# Patient Record
Sex: Male | Born: 1960 | Race: White | Hispanic: No | Marital: Single | Smoking: Former smoker
Health system: Southern US, Community
[De-identification: ages and names within clinical notes are randomized; demographics above are authoritative.]

## PROBLEM LIST (undated history)

## (undated) DIAGNOSIS — I10 Essential (primary) hypertension: Secondary | ICD-10-CM

## (undated) DIAGNOSIS — I639 Cerebral infarction, unspecified: Secondary | ICD-10-CM

## (undated) HISTORY — PX: HERNIA REPAIR: SHX51

## (undated) HISTORY — PX: KNEE ARTHROSCOPY: SUR90

---

## 2010-05-21 ENCOUNTER — Emergency Department: Payer: Self-pay | Admitting: Emergency Medicine

## 2010-06-30 ENCOUNTER — Emergency Department: Payer: Self-pay | Admitting: Emergency Medicine

## 2010-12-20 ENCOUNTER — Emergency Department: Payer: Self-pay | Admitting: Internal Medicine

## 2011-08-06 ENCOUNTER — Emergency Department: Payer: Self-pay | Admitting: Emergency Medicine

## 2011-08-07 ENCOUNTER — Inpatient Hospital Stay: Payer: Self-pay | Admitting: Surgery

## 2011-08-07 LAB — COMPREHENSIVE METABOLIC PANEL
Albumin: 3.9 g/dL (ref 3.4–5.0)
Alkaline Phosphatase: 44 U/L — ABNORMAL LOW (ref 50–136)
Calcium, Total: 9 mg/dL (ref 8.5–10.1)
Chloride: 104 mmol/L (ref 98–107)
Co2: 25 mmol/L (ref 21–32)
Creatinine: 0.87 mg/dL (ref 0.60–1.30)
EGFR (African American): >60
EGFR (Non-African Amer.): >60
Osmolality: 281 (ref 275–301)
Potassium: 4.3 mmol/L (ref 3.5–5.1)
SGPT (ALT): 40 U/L
Sodium: 139 mmol/L (ref 136–145)

## 2011-08-07 LAB — LIPASE, BLOOD: Lipase: 71 U/L — ABNORMAL LOW (ref 73–393)

## 2011-08-07 LAB — CBC
Platelet: 227 10*3/uL (ref 150–440)
RBC: 5.22 10*6/uL (ref 4.40–5.90)
RDW: 12.9 % (ref 11.5–14.5)
WBC: 15.3 10*3/uL — ABNORMAL HIGH (ref 3.8–10.6)

## 2011-08-07 LAB — URINALYSIS, COMPLETE
Blood: NEGATIVE
Ketone: NEGATIVE
Leukocyte Esterase: NEGATIVE
Ph: 5 (ref 4.5–8.0)
RBC,UR: <1 /HPF (ref 0–5)
Squamous Epithelial: NONE SEEN

## 2011-08-07 LAB — PROTIME-INR
INR: 1
Prothrombin Time: 13.8 secs (ref 11.5–14.7)

## 2011-08-12 LAB — PATHOLOGY REPORT

## 2012-03-09 ENCOUNTER — Ambulatory Visit: Payer: Self-pay | Admitting: Unknown Physician Specialty

## 2014-10-11 ENCOUNTER — Ambulatory Visit
Admit: 2014-10-11 | Disposition: A | Discharge status: Home / Self Care | Payer: Self-pay | Attending: Surgery | Admitting: Surgery

## 2014-10-23 NOTE — Op Note (Signed)
PATIENT NAME:  Kenneth Clayton, [name] MR#:  [id number] DATE OF BIRTH:  [birth year]  DATE OF PROCEDURE:  08/07/2011  PREOPERATIVE DIAGNOSIS: Incarcerated recurrent ventral hernia.   POSTOPERATIVE DIAGNOSIS: Incarcerated recurrent ventral hernia.   PROCEDURE: Repair of incarcerated recurrent ventral hernia with mesh.   SURGEON: Dionne Miloichard Titiana Severa, M.D.   ASSISTANT: Hulda Marinimothy Oaks M.D.   ANESTHESIA: General with endotracheal tube.   INDICATIONS: This is a patient with a recurrent ventral hernia. It has been repaired multiple times including laparoscopic repair, has recurred and is currently incarcerated. Preoperatively, we discussed the rationale for surgery, the options of observation, the risk of bleeding, infection, mesh placement, mesh infection, and the high risk of recurrence,  and the potential for alternatives to prosthetic mesh versus primary repair versus allogeneic meshes. This was all reviewed for him. He understood and agreed to proceed.   FINDINGS: Long-standing incarceration involving small bowel and a very redundant hernia sac with lots of scar.   The previously placed laparoscopic mesh had clearly slid inferior. The rent itself measured 4 x 3 cm, but the hernia sac was easily 30 cm in size.   Very few adhesions were present inside the abdominal cavity.   DESCRIPTION OF PROCEDURE: The patient was induced to general anesthesia and given IV antibiotics. VTE prophylaxis was in place. He was prepped and draped in a sterile fashion. Marcaine was infiltrated in the skin and subcutaneous tissues and then a transverse incision starting near the midline was performed extending out laterally on the right side over the palpable and visible hernia sac. The hernia sac was dissected carefully off of the subcutaneous tissues down to the fascia. It was then opened and incarcerated small bowel was inspected, found to be viable and in very good condition. It was then reduced. The hernia sac was excised. Fascial  edges were cleaned and the abdominal cavity was explored through this small 4 x 3 cm rent. Very few adhesions were present inside the abdomen. The mesh itself from the previous laparoscopic repair was palpable inferior or caudad to the wound and there were multiple Bard tacks around that mesh, but not so much around the hernia opening at this time. It had slid caudad.  The hernia rent was measured and an 8 cm Ventralex mesh by Bard with PTFE was placed into the abdominal cavity. It was then held in with U sutures of 0 Prolene and then further tacked down with simple sutures or figure-of-eight sutures of 0 Prolene as well. The remnant of the hernia sac was closed over the top of this mesh with Prolene figure-of-eight sutures and then additional Marcaine was placed for a total of 30 mL. Once assuring that hemostasis was adequate and the sponge, lap and needle counts were correct, the wound was closed with two layers of 0 Vicryl to decrease dead space and then skin staples were placed, and a sterile dressing was placed.  The patient tolerated the procedure well. There were no complications. He was taken to the Recovery Room in stable condition to be admitted for continued care.  ____________________________ Kenneth Salvageichard E. Excell Seltzerooper, MD rec:slb D: 08/07/2011 13:54:06 ET T: 08/07/2011 14:21:33 ET JOB#: [id number]  cc: Kenneth Salvageichard E. Excell Seltzerooper, MD, <Dictator> Lattie HawICHARD E Criss Pallone MD ELECTRONICALLY SIGNED 08/08/2011 13:38

## 2014-10-23 NOTE — H&P (Signed)
PATIENT NAME:  Kenneth Clayton, [name] MR#:  [id number] DATE OF BIRTH:  10-Apr-1961  DATE OF ADMISSION:  08/07/2011  PRIMARY CARE PHYSICIAN: Veneda MelterMaureen Andreassi, MD   ADMITTING PHYSICIAN: Dr. Michela PitcherEly   CHIEF COMPLAINT: Abdominal pain.   BRIEF HISTORY: Kenneth DrainDuane Silos is a 54 year old gentleman seen in the Emergency Room with sudden onset of abdominal pain and a known ventral hernia yesterday morning at approximately 9 o'clock. He has had two previous abdominal wall hernia repairs, one as a child and one approximately five years ago. The first was done as an open procedure and the second was done as a laparoscopic procedure. Both procedures were performed elsewhere. He relates that the hernia never completely resolved and he had discomfort and swelling in the area since the time of the second repair. He has lost 55 pounds in the last year and has done it with a combination of vigorous exercise and diet control. He is lifting weights with an abdominal wall binder. Yesterday morning while sitting in a meeting he noted the sudden onset of pain in the hernia. Normally he is able to lie down and reduce the hernia manually but he could not reduce it yesterday. He presented to the Emergency Room approximately midnight last night with severe abdominal pain and a nonreducible hernia. It could not be reduced in triage and he was set up for further evaluation.   He has vomited twice. He did have a bowel movement yesterday morning but has not passed any gas since that time. He has no other previous abdominal surgery. He has no history of hepatitis, yellow jaundice, pancreatitis, peptic ulcer disease, gallbladder disease, or diverticulitis.   His other medical problems include a previous stroke five years ago. He suffered sudden onset of left-sided hemiparesis. At the time of the stroke, he was felt to have a thrombosis and he underwent an invasive procedure for clot extraction and thrombolysis. His stroke resolved immediately. He  was treated with blood thinners for some time but then placed on an aspirin regimen and has been on an aspirin regimen since that time. He has had no further symptoms. He recently had a carotid ultrasound which was unremarkable. Denies any cardiac disease, hypertension, or diabetes. He does have history of depression. He is not a cigarette smoker. Drinks no alcohol. 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   REVIEW OF SYSTEMS: 10 point review of systems is outlined with the patient in detail and all is negative except as noted above.   PHYSICAL EXAMINATION:   VITAL SIGNS: Blood pressure 171/84, heart rate 77 and regular, oxygen saturation 97%, temperature 98.7.   GENERAL: He is a large vigorous gentleman who appears to be in moderate distress from pain.   HEENT: Unremarkable. He has no scleral icterus. His pupils were equally round. He has no facial deformity.   NECK: Supple without adenopathy.   CHEST: Clear with no adventitious sounds.   CARDIAC: No murmurs or gallops, seems to be in normal sinus rhythm.   ABDOMEN: Generally soft but he has an obvious visible hernia in the right abdominal wall. With multiple maneuvers it is not reducible. He has mild tenderness and does appear to be thickened laterally. He has hypoactive but present bowel sounds. No inguinal hernias are noted.   LOWER EXTREMITIES: Full range of motion. Normal pulses. No deformities.   PSYCHIATRIC: Normal affect and normal orientation.   I have independently reviewed his CT scan. He does appear to have significantly  decompressed bowel distally and dilated bowel proximally. There does not appear to be any other significant abdominal abnormality but the official report is pending.   IMPRESSION: This gentleman appears to have an incarcerated ventral wall hernia. I do not see any evidence of strangulation although his white blood cell count is slightly elevated. CO2 is within normal  limits. However in this situation now out 22 hours from the initial insult, he should probably proceed urgently to surgery. I will discuss this plan with my associate, Dr. Dionne Milo, who will make a decision shortly about the timing of intervention.    I've discussed this plan with the patient in detail and he is in agreement.   ____________________________ Carmie End, MD rle:drc D: 08/07/2011 06:54:23 ET T: 08/07/2011 07:54:39 ET JOB#: [id number]  cc: Carmie End, MD, <Dictator> Maureen P. Alison Murray, MD Quentin Ore MD ELECTRONICALLY SIGNED 08/11/2011 21:14

## 2014-10-23 NOTE — Discharge Summary (Signed)
PATIENT NAME:  Kenneth Clayton, Kenneth Clayton MR#:  [id number] DATE OF BIRTH:  Dec 16, 1960  DATE OF ADMISSION:  08/07/2011 DATE OF DISCHARGE:  08/08/2011  DISCHARGE DIAGNOSES:  1. Obesity.  2. History of cerebrovascular accident. 3. Incarcerated recurrent ventral hernia.   PROCEDURE: Repair of incarcerated recurrent ventral hernia with mesh.   HISTORY OF PRESENT ILLNESS/HOSPITAL COURSE: This is a patient with a longstanding recurrence of a ventral hernia. He has had it repaired multiple times including one laparoscopic repair with mesh. He presented with a large mass to the right side of his midline, which has been there for a long time, but he had the acute onset of pain which caused him to lay down at work. It was unrelenting pain. Both the Emergency Room physician and Dr. Michela PitcherEly were not able to reduce it. He was taken to the operating room where inspection of the small bowel that was incarcerated within the longstanding sac was performed. There was no sign of ischemia; in fact, the bowel looked very normal. It was reduced and a Ventralex  patch was placed into the abdominal cavity. Postoperatively he did well. He tolerated a regular diet and his pain control was adequate on Percocet. He is discharged in stable condition to follow up in my office in 10 days.    ____________________________ Adah Salvageichard E. Excell Seltzerooper, MD rec:bjt D: 08/08/2011 16:04:18 ET T: 08/09/2011 10:48:03 ET JOB#: [id number]  cc: Adah Salvageichard E. Excell Seltzerooper, MD, <Dictator> Lattie HawICHARD E Bettie Swavely MD ELECTRONICALLY SIGNED 08/12/2011 19:10

## 2014-12-22 ENCOUNTER — Encounter
Admission: RE | Admit: 2014-12-22 | Discharge: 2014-12-22 | Disposition: A | Discharge status: Home / Self Care | Payer: BLUE CROSS/BLUE SHIELD | Source: Ambulatory Visit | Attending: Surgery | Admitting: Surgery

## 2014-12-22 DIAGNOSIS — I6930 Unspecified sequelae of cerebral infarction: Secondary | ICD-10-CM | POA: Insufficient documentation

## 2014-12-22 DIAGNOSIS — M109 Gout, unspecified: Secondary | ICD-10-CM | POA: Diagnosis not present

## 2014-12-22 DIAGNOSIS — E780 Pure hypercholesterolemia: Secondary | ICD-10-CM | POA: Insufficient documentation

## 2014-12-22 DIAGNOSIS — S43402A Unspecified sprain of left shoulder joint, initial encounter: Secondary | ICD-10-CM | POA: Diagnosis not present

## 2014-12-22 DIAGNOSIS — Z87891 Personal history of nicotine dependence: Secondary | ICD-10-CM | POA: Diagnosis not present

## 2014-12-22 DIAGNOSIS — I10 Essential (primary) hypertension: Secondary | ICD-10-CM | POA: Insufficient documentation

## 2014-12-22 DIAGNOSIS — Z01810 Encounter for preprocedural cardiovascular examination: Secondary | ICD-10-CM | POA: Diagnosis not present

## 2014-12-22 HISTORY — DX: Cerebral infarction, unspecified: I63.9

## 2014-12-22 HISTORY — DX: Essential (primary) hypertension: I10

## 2014-12-22 NOTE — Patient Instructions (Addendum)
  Your procedure is scheduled on: December 27, 2014 (Tuesday) Report to Day Surgery. To find out your arrival time please call [phone] between 1PM - 3PM on December 26, 2014 (Monday).  Remember: Instructions that are not followed completely may result in serious medical risk, up to and including death, or upon the discretion of your surgeon and anesthesiologist your surgery may need to be rescheduled.    __x_ 1. Do not eat food or drink liquids after midnight. No gum chewing or hard candies.     ____ 2. No Alcohol for 24 hours before or after surgery.   ____ 3. Bring all medications with you on the day of surgery if instructed.    __x_ 4. Notify your doctor if there is any change in your medical condition     (cold, fever, infections).     Do not wear jewelry, make-up, hairpins, clips or nail polish.  Do not wear lotions, powders, or perfumes. You may wear deodorant.  Do not shave 48 hours prior to surgery. Men may shave face and neck.  Do not bring valuables to the hospital.    [facility] is not responsible for any belongings or valuables.               Contacts, dentures or bridgework may not be worn into surgery.  Leave your suitcase in the car. After surgery it may be brought to your room.  For patients admitted to the hospital, discharge time is determined by your                treatment team.   Patients discharged the day of surgery will not be allowed to drive home.   Please read over the following fact sheets that you were given:   Surgical Site Infection Prevention   ____ Take these medicines the morning of surgery with A SIP OF WATER:    1.   2.   3.   4.  5.  6.  ____ Fleet Enema (as directed)   __x__ Use CHG Soap as directed  ____ Use inhalers on the day of surgery  ____ Stop metformin 2 days prior to surgery    ____ Take 1/2 of usual insulin dose the night before surgery and none on the morning of surgery.   ____ Stop Coumadin/Plavix/aspirin on   __x__  Stop Anti-inflammatories on (STOP Ibuprofen now) TYLENOL ok to take for pain   ____ Stop supplements until after surgery.    ____ Bring C-Pap to the hospital.

## 2014-12-27 ENCOUNTER — Encounter: Payer: Self-pay | Admitting: *Deleted

## 2014-12-27 ENCOUNTER — Ambulatory Visit
Admission: RE | Admit: 2014-12-27 | Discharge: 2014-12-27 | Disposition: A | Discharge status: Home / Self Care | Payer: BLUE CROSS/BLUE SHIELD | Source: Ambulatory Visit | Attending: Surgery | Admitting: Surgery

## 2014-12-27 ENCOUNTER — Ambulatory Visit: Payer: BLUE CROSS/BLUE SHIELD | Admitting: Anesthesiology

## 2014-12-27 ENCOUNTER — Encounter
Admission: RE | Disposition: A | Discharge status: Home / Self Care | Payer: Self-pay | Source: Ambulatory Visit | Attending: Surgery

## 2014-12-27 DIAGNOSIS — M75122 Complete rotator cuff tear or rupture of left shoulder, not specified as traumatic: Secondary | ICD-10-CM | POA: Insufficient documentation

## 2014-12-27 DIAGNOSIS — Z9889 Other specified postprocedural states: Secondary | ICD-10-CM | POA: Diagnosis not present

## 2014-12-27 DIAGNOSIS — Z833 Family history of diabetes mellitus: Secondary | ICD-10-CM | POA: Insufficient documentation

## 2014-12-27 DIAGNOSIS — E780 Pure hypercholesterolemia: Secondary | ICD-10-CM | POA: Insufficient documentation

## 2014-12-27 DIAGNOSIS — Z8673 Personal history of transient ischemic attack (TIA), and cerebral infarction without residual deficits: Secondary | ICD-10-CM | POA: Insufficient documentation

## 2014-12-27 DIAGNOSIS — I10 Essential (primary) hypertension: Secondary | ICD-10-CM | POA: Diagnosis not present

## 2014-12-27 DIAGNOSIS — S43402A Unspecified sprain of left shoulder joint, initial encounter: Secondary | ICD-10-CM | POA: Diagnosis present

## 2014-12-27 DIAGNOSIS — M109 Gout, unspecified: Secondary | ICD-10-CM | POA: Insufficient documentation

## 2014-12-27 DIAGNOSIS — Z79899 Other long term (current) drug therapy: Secondary | ICD-10-CM | POA: Diagnosis not present

## 2014-12-27 DIAGNOSIS — Z8249 Family history of ischemic heart disease and other diseases of the circulatory system: Secondary | ICD-10-CM | POA: Insufficient documentation

## 2014-12-27 DIAGNOSIS — M7542 Impingement syndrome of left shoulder: Secondary | ICD-10-CM | POA: Insufficient documentation

## 2014-12-27 DIAGNOSIS — Z87891 Personal history of nicotine dependence: Secondary | ICD-10-CM | POA: Insufficient documentation

## 2014-12-27 DIAGNOSIS — F329 Major depressive disorder, single episode, unspecified: Secondary | ICD-10-CM | POA: Diagnosis not present

## 2014-12-27 HISTORY — PX: SHOULDER ARTHROSCOPY WITH OPEN ROTATOR CUFF REPAIR: SHX6092

## 2014-12-27 SURGERY — ARTHROSCOPY, SHOULDER WITH REPAIR, ROTATOR CUFF, OPEN
Anesthesia: Regional | Site: Shoulder | Laterality: Left | Wound class: Clean

## 2014-12-27 MED ORDER — EPINEPHRINE HCL 1 MG/ML IJ SOLN
INTRAMUSCULAR | Status: DC | PRN
  Administered 2014-12-27: 2 mL

## 2014-12-27 MED ORDER — DEXTROSE 5 % IV SOLN
3.0000 g | Freq: Once | INTRAVENOUS | Status: AC
  Administered 2014-12-27: 3 g via INTRAVENOUS
  Filled 2014-12-27: qty 3000

## 2014-12-27 MED ORDER — OXYCODONE HCL 5 MG PO TABS: 5.0000 mg | ORAL_TABLET | ORAL | Status: AC | PRN

## 2014-12-27 MED ORDER — PHENYLEPHRINE HCL 10 MG/ML IJ SOLN
INTRAMUSCULAR | Status: DC | PRN
  Administered 2014-12-27: 200 ug via INTRAVENOUS

## 2014-12-27 MED ORDER — FENTANYL CITRATE (PF) 100 MCG/2ML IJ SOLN
INTRAMUSCULAR | Status: AC
  Administered 2014-12-27: 50 ug via INTRAVENOUS
  Filled 2014-12-27: qty 2

## 2014-12-27 MED ORDER — BUPIVACAINE-EPINEPHRINE (PF) 0.5% -1:200000 IJ SOLN
INTRAMUSCULAR | Status: AC
  Filled 2014-12-27: qty 30

## 2014-12-27 MED ORDER — METOCLOPRAMIDE HCL 10 MG PO TABS: 5.0000 mg | ORAL_TABLET | Freq: Three times a day (TID) | ORAL | Status: DC | PRN

## 2014-12-27 MED ORDER — ONDANSETRON HCL 4 MG/2ML IJ SOLN: 4.0000 mg | Freq: Four times a day (QID) | INTRAMUSCULAR | Status: DC | PRN

## 2014-12-27 MED ORDER — SUCCINYLCHOLINE CHLORIDE 20 MG/ML IJ SOLN
INTRAMUSCULAR | Status: DC | PRN
  Administered 2014-12-27: 100 mg via INTRAVENOUS

## 2014-12-27 MED ORDER — MIDAZOLAM HCL 2 MG/2ML IJ SOLN
1.0000 mg | Freq: Once | INTRAMUSCULAR | Status: AC
  Administered 2014-12-27: 1 mg via INTRAVENOUS

## 2014-12-27 MED ORDER — FAMOTIDINE 20 MG PO TABS
20.0000 mg | ORAL_TABLET | Freq: Once | ORAL | Status: AC
  Administered 2014-12-27: 20 mg via ORAL

## 2014-12-27 MED ORDER — BUPIVACAINE-EPINEPHRINE 0.5% -1:200000 IJ SOLN
INTRAMUSCULAR | Status: DC | PRN
  Administered 2014-12-27: 20 mL

## 2014-12-27 MED ORDER — PROPOFOL 10 MG/ML IV BOLUS
INTRAVENOUS | Status: DC | PRN
  Administered 2014-12-27: 200 mg via INTRAVENOUS

## 2014-12-27 MED ORDER — LACTATED RINGERS IV SOLN
INTRAVENOUS | Status: DC
  Administered 2014-12-27: 09:00:00 via INTRAVENOUS

## 2014-12-27 MED ORDER — LIDOCAINE HCL (PF) 1 % IJ SOLN
INTRAMUSCULAR | Status: AC
  Filled 2014-12-27: qty 5

## 2014-12-27 MED ORDER — POTASSIUM CHLORIDE IN NACL 20-0.9 MEQ/L-% IV SOLN
INTRAVENOUS | Status: DC
  Filled 2014-12-27 (×3): qty 1000

## 2014-12-27 MED ORDER — LIDOCAINE HCL (CARDIAC) 20 MG/ML IV SOLN
INTRAVENOUS | Status: DC | PRN
  Administered 2014-12-27: 80 mg via INTRAVENOUS

## 2014-12-27 MED ORDER — ONDANSETRON HCL 4 MG/2ML IJ SOLN
INTRAMUSCULAR | Status: DC | PRN
  Administered 2014-12-27: 4 mg via INTRAVENOUS

## 2014-12-27 MED ORDER — OXYCODONE HCL 5 MG PO TABS: 5.0000 mg | ORAL_TABLET | ORAL | Status: DC | PRN

## 2014-12-27 MED ORDER — FAMOTIDINE 20 MG PO TABS
ORAL_TABLET | ORAL | Status: AC
  Administered 2014-12-27: 20 mg via ORAL
  Filled 2014-12-27: qty 1

## 2014-12-27 MED ORDER — DEXAMETHASONE SODIUM PHOSPHATE 4 MG/ML IJ SOLN
INTRAMUSCULAR | Status: DC | PRN
  Administered 2014-12-27: 10 mg via INTRAVENOUS

## 2014-12-27 MED ORDER — ONDANSETRON HCL 4 MG PO TABS: 4.0000 mg | ORAL_TABLET | Freq: Four times a day (QID) | ORAL | Status: DC | PRN

## 2014-12-27 MED ORDER — FENTANYL CITRATE (PF) 100 MCG/2ML IJ SOLN
INTRAMUSCULAR | Status: DC | PRN
  Administered 2014-12-27: 2 ug via INTRAVENOUS

## 2014-12-27 MED ORDER — ROCURONIUM BROMIDE 100 MG/10ML IV SOLN
INTRAVENOUS | Status: DC | PRN
  Administered 2014-12-27: 10 mg via INTRAVENOUS
  Administered 2014-12-27: 20 mg via INTRAVENOUS
  Administered 2014-12-27: 30 mg via INTRAVENOUS

## 2014-12-27 MED ORDER — ONDANSETRON HCL 4 MG/2ML IJ SOLN: 4.0000 mg | Freq: Once | INTRAMUSCULAR | Status: DC | PRN

## 2014-12-27 MED ORDER — FENTANYL CITRATE (PF) 100 MCG/2ML IJ SOLN
INTRAMUSCULAR | Status: AC
  Administered 2014-12-27: 25 ug via INTRAVENOUS
  Filled 2014-12-27: qty 2

## 2014-12-27 MED ORDER — FENTANYL CITRATE (PF) 100 MCG/2ML IJ SOLN
25.0000 ug | INTRAMUSCULAR | Status: DC | PRN
  Administered 2014-12-27: 25 ug via INTRAVENOUS

## 2014-12-27 MED ORDER — FENTANYL CITRATE (PF) 100 MCG/2ML IJ SOLN
50.0000 ug | Freq: Once | INTRAMUSCULAR | Status: AC
Start: 2014-12-27 — End: 2014-12-27
  Administered 2014-12-27: 50 ug via INTRAVENOUS

## 2014-12-27 MED ORDER — MIDAZOLAM HCL 5 MG/5ML IJ SOLN
INTRAMUSCULAR | Status: AC
  Administered 2014-12-27: 1 mg via INTRAVENOUS
  Filled 2014-12-27: qty 5

## 2014-12-27 MED ORDER — METOCLOPRAMIDE HCL 5 MG/ML IJ SOLN: 5.0000 mg | Freq: Three times a day (TID) | INTRAMUSCULAR | Status: DC | PRN

## 2014-12-27 MED ORDER — EPINEPHRINE HCL 1 MG/ML IJ SOLN
INTRAMUSCULAR | Status: AC
  Filled 2014-12-27: qty 1

## 2014-12-27 MED ORDER — MIDAZOLAM HCL 2 MG/2ML IJ SOLN
INTRAMUSCULAR | Status: DC | PRN
  Administered 2014-12-27: 2 mg via INTRAVENOUS

## 2014-12-27 MED ORDER — HYDROMORPHONE HCL 1 MG/ML IJ SOLN: 0.2500 mg | INTRAMUSCULAR | Status: DC | PRN

## 2014-12-27 MED ORDER — GLYCOPYRROLATE 0.2 MG/ML IJ SOLN
INTRAMUSCULAR | Status: DC | PRN
  Administered 2014-12-27: .8 mg via INTRAVENOUS

## 2014-12-27 MED ORDER — NEOSTIGMINE METHYLSULFATE 10 MG/10ML IV SOLN
INTRAVENOUS | Status: DC | PRN
  Administered 2014-12-27: 5 mg via INTRAVENOUS

## 2014-12-27 MED ORDER — ROPIVACAINE HCL 5 MG/ML IJ SOLN
INTRAMUSCULAR | Status: AC
  Administered 2014-12-27: 20 mL via PERINEURAL
  Filled 2014-12-27: qty 20

## 2014-12-27 SURGICAL SUPPLY — 45 items
ANCHOR JUGGERKNOT WTAP NDL 2.9 (Anchor) ×6 IMPLANT
BIT DRILL JUGRKNT W/NDL BIT2.9 (DRILL) ×1 IMPLANT
BLADE FULL RADIUS 3.5 (BLADE) IMPLANT
BLADE SHAVER 4.5X7 STR FR (MISCELLANEOUS) IMPLANT
BUR ACROMIONIZER 4.0 (BURR) ×3 IMPLANT
BUR BR 5.5 WIDE MOUTH (BURR) IMPLANT
CANNULA 8.5X75 THRED (CANNULA) ×6 IMPLANT
CANNULA SHAVER 8MMX76MM (CANNULA) IMPLANT
CHLORAPREP W/TINT 26ML (MISCELLANEOUS) ×6 IMPLANT
COVER MAYO STAND STRL (DRAPES) ×3 IMPLANT
DRAPE IMP U-DRAPE 54X76 (DRAPES) ×3 IMPLANT
DRAPE STERI 35X30 U-POUCH (DRAPES) ×3 IMPLANT
DRAPE SURG 17X11 SM STRL (DRAPES) ×3 IMPLANT
DRILL JUGGERKNOT W/NDL BIT 2.9 (DRILL) ×3
GAUZE PETRO XEROFOAM 1X8 (MISCELLANEOUS) ×3 IMPLANT
GAUZE SPONGE 4X4 12PLY STRL (GAUZE/BANDAGES/DRESSINGS) ×3 IMPLANT
GLOVE BIO SURGEON STRL SZ8 (GLOVE) ×6 IMPLANT
GLOVE INDICATOR 8.0 STRL GRN (GLOVE) ×3 IMPLANT
GOWN STRL REUS W/ TWL LRG LVL3 (GOWN DISPOSABLE) ×2 IMPLANT
GOWN STRL REUS W/ TWL XL LVL3 (GOWN DISPOSABLE) ×1 IMPLANT
GOWN STRL REUS W/TWL LRG LVL3 (GOWN DISPOSABLE) ×4
GOWN STRL REUS W/TWL XL LVL3 (GOWN DISPOSABLE) ×2
GRASPER SUT 15 45D LOW PRO (SUTURE) ×6 IMPLANT
IV LACTATED RINGER IRRG 3000ML (IV SOLUTION) ×4
IV LR IRRIG 3000ML ARTHROMATIC (IV SOLUTION) ×2 IMPLANT
Juggerknot soft anchors ×6 IMPLANT
MANIFOLD NEPTUNE II (INSTRUMENTS) ×3 IMPLANT
MASK FACE SPIDER DISP (MASK) ×3 IMPLANT
MAT BLUE FLOOR 46X72 FLO (MISCELLANEOUS) ×3 IMPLANT
NEEDLE REVERSE CUT 1/2 CRC (NEEDLE) ×3 IMPLANT
PACK ARTHROSCOPY SHOULDER (MISCELLANEOUS) ×3 IMPLANT
PAD GROUND ADULT SPLIT (MISCELLANEOUS) ×3 IMPLANT
SLING ARM LRG DEEP (SOFTGOODS) ×3 IMPLANT
SLING ULTRA II LG (MISCELLANEOUS) IMPLANT
STAPLER SKIN PROX 35W (STAPLE) ×3 IMPLANT
STRAP SAFETY BODY (MISCELLANEOUS) ×3 IMPLANT
SUT ETHIBOND 0 MO6 C/R (SUTURE) ×3 IMPLANT
SUT PROLENE 4 0 PS 2 18 (SUTURE) ×3 IMPLANT
SUT VIC AB 2-0 CT1 27 (SUTURE) ×4
SUT VIC AB 2-0 CT1 TAPERPNT 27 (SUTURE) ×2 IMPLANT
TAPE MICROFOAM 4IN (TAPE) ×3 IMPLANT
TUBING ARTHRO INFLOW-ONLY STRL (TUBING) ×3 IMPLANT
TUBING CONNECTING 10 (TUBING) ×2 IMPLANT
TUBING CONNECTING 10' (TUBING) ×1
WAND HAND CNTRL MULTIVAC 90 (MISCELLANEOUS) ×3 IMPLANT

## 2014-12-27 NOTE — Anesthesia Procedure Notes (Addendum)
Anesthesia Regional Block:  Interscalene brachial plexus block  Pre-Anesthetic Checklist: ,, timeout performed, Correct Patient, Correct Site, Correct Laterality, Correct Procedure, Correct Position, site marked, Risks and benefits discussed,  Surgical consent,  Pre-op evaluation,  At surgeon's request and post-op pain management   Prep: chloraprep       Needles:  Injection technique: Single-shot  Needle Type: Stimiplex     Needle Length: 5cm 5 cm Needle Gauge: 22 and 22 G    Additional Needles:  Procedures: ultrasound guided (picture in chart) and nerve stimulator Interscalene brachial plexus block  Nerve Stimulator or Paresthesia:  Response: deltoid twitch, 0.4 mA, 5 cm  Additional Responses:   Narrative:  Start time: 12/27/2014 9:25 AM End time: 12/27/2014 9:35 AM Injection made incrementally with aspirations every 5 mL.  Performed by: Personally   Additional Notes: Functioning IV was confirmed and monitors were applied.  A 50mm 22ga Stimuplex needle was used. Sterile prep and drape,hand hygiene and sterile gloves were used.  Negative aspiration and negative test dose prior to incremental administration of local anesthetic using B-smart monitor. The patient tolerated the procedure well.      Procedure Name: Intubation Date/Time: 12/27/2014 10:09 AM Performed by: Chong SicilianLOPEZ, Ericha Whittingham Pre-anesthesia Checklist: Patient identified, Emergency Drugs available, Suction available, Patient being monitored and Timeout performed Patient Re-evaluated:Patient Re-evaluated prior to inductionOxygen Delivery Method: Circle system utilized Preoxygenation: Pre-oxygenation with 100% oxygen Intubation Type: IV induction Ventilation: Mask ventilation without difficulty Laryngoscope Size: Mac and 4 Grade View: Grade II Tube type: Oral Tube size: 7.5 mm Number of attempts: 1 Airway Equipment and Method: Stylet Placement Confirmation: ETT inserted through vocal cords under direct vision,   positive ETCO2 and breath sounds checked- equal and bilateral Secured at: 22 cm Tube secured with: Tape Dental Injury: Teeth and Oropharynx as per pre-operative assessment

## 2014-12-27 NOTE — Discharge Instructions (Signed)
Keep dressing dry and intact.  °May shower after dressing changed on post-op day #4 (Monday).  °Cover staples/sutures with Band-Aids after drying off. °Apply ice frequently to shoulder. °Keep shoulder immobilizer on at all times except may remove for bathing purposes. °Follow-up in 10-14 days or as scheduled. °

## 2014-12-27 NOTE — H&P (Signed)
Paper H&P to be scanned into permanent record. H&P reviewed. No changes. 

## 2014-12-27 NOTE — OR Nursing (Signed)
While patient was being clipped for left shoulder surgery the skin at lower chest outer aspect was slightly torn. Two two inch areas are reddened. Will show to Dr, Joice LoftsPoggi.

## 2014-12-27 NOTE — Op Note (Signed)
12/27/2014  12:17 PM  Patient:   Kenneth Clayton  Pre-Op Diagnosis:   Impingement/tendinopathy with rotator cuff tear, left shoulder.  Postoperative diagnosis: Impingement/tendinopathy with rotator cuff tear and labral fraying, left shoulder.  Procedure: Limited arthroscopic debridement, arthroscopic subacromial decompression, and mini-open rotator cuff repair, left shoulder.  Anesthesia: General endotracheal with interscalene block placed preoperatively by the anesthesiologist.  Surgeon:   Maryagnes Amos, MD  Assistant:   Horris Latino, PA-C  Findings: As above. There was a full-thickness tear involving the anterior insertional fibers of the supraspinatus tendon measuring approximately 1 x 1 cm. The remaining portions of the rotator cuff all were in satisfactory condition. There was extensive labral fraying anteriorly and superiorly without frank detachment. The biceps itself appeared to be in satisfactory condition. There were grade 1 chondromalacial changes in the center of the glenoid.   Complications: None  Fluids:   550 cc  Estimated blood loss: 5 cc  Tourniquet time: None  Drains: None  Closure: Staples   Brief clinical note: The patient is a 54 year old male with a 6+ month history of progressively worsening left shoulder pain. The patient's symptoms have progressed despite medications, activity modification, etc. The patient's history and examination are consistent with impingement/tendinopathy with a rotator cuff tear. These findings were confirmed by MRI scan. The patient presents at this time for definitive management of these shoulder symptoms.  Procedure: The patient underwent placement of an interscalene block in the preoperative holding area by the anesthesiologist. The patient was brought to the operating room and lain in the supine position on the operative table. He underwent general endotracheal intubation and anesthesia before being  repositioned in the beach chair position using the beach chair position. The left shoulder and upper extremity were prepped with ChloraPrep solution before being draped sterilely. Preoperative antibiotics were administered. A timeout was performed to confirm the proper side was prepped before the expected portal sites and incision site were injected with 0.5% Sensorcaine with epinephrine. A posterior portal was created and the glenohumeral joint thoroughly inspected with the findings as described above. An anterior portal was created using an outside-in technique. The labrum and rotator cuff were further probed, again confirming the above-noted findings. The areas of labral fraying were debrided back to stable margins using the full-radius resector, as was the torn portion of the rotator cuff. The labral attachment was carefully probed and found to be intact. The ArthroCare wand was inserted and used to obtain hemostasis as well as to "anneal" the labrum superiorly and anteriorly. The instruments were removed from the joint after suctioning the excess fluid.  The camera was repositioned through the posterior portal into the subacromial space. A separate lateral portal was created using an outside-in technique. The 3.5 full-radius resector was introduced and used to perform a subtotal bursectomy. The ArthroCare wand was then inserted and used to remove the periosteal tissue off the undersurface of the anterior third of the acromion as well as to recess the coracoacromial ligament from its attachment along the anterior and lateral margins of the acromion. The 4.0 mm acromionizing bur was introduced and used to complete the decompression by removing the undersurface of the anterior third of the acromion. The full radius resector was reintroduced to remove any residual bony debris before the ArthroCare wand was reintroduced to obtain hemostasis. The instruments were then removed from the subacromial space after  suctioning the excess fluid.  An approximately 4-5 cm incision was made over the anterolateral aspect of the shoulder  beginning at the anterolateral corner of the acromion and extending distally in line with the bicipital groove. This incision was carried down through the subcutaneous tissues to expose the deltoid fascia. The raphae between the anterior and middle thirds was identified and this plane developed to provide access into the subacromial space. Additional bursal tissues were debrided sharply using Metzenbaum scissors. The rotator cuff tear was readily identified. The margins were debrided sharply with a #15 blade and the exposed greater tuberosity roughened with a rongeur. The tear was repaired using two Biomet 2.9 mm JuggerKnot anchors. Several of these sutures were then brought back laterally through bone tunnels and tied over bone bridges to create a two-layer closure. An apparent watertight closure was obtained.  The wound was copiously irrigated with sterile saline solution before the deltoid raphae was reapproximated using 2-0 Vicryl interrupted sutures. The subcutaneous tissues were closed in two layers using 2-0 Vicryl interrupted sutures before the skin was closed using staples. The portal sites also were closed using staples. A sterile bulky dressing was applied to the shoulder before the arm was placed into a shoulder immobilizer. The patient was then awakened, extubated, and returned to the recovery room in satisfactory condition after tolerating the procedure well.

## 2014-12-27 NOTE — Transfer of Care (Signed)
Immediate Anesthesia Transfer of Care Note  Patient: Kenneth Clayton  Procedure(s) Performed: Procedure(s): left shoulder arthroscopy, arthroscopic debridement, open rotator cuff repair (Left)  Patient Location: PACU  Anesthesia Type:General and Regional  Level of Consciousness: awake, alert , oriented and patient cooperative  Airway & Oxygen Therapy: Patient Spontanous Breathing and Patient connected to face mask oxygen  Post-op Assessment: Report given to RN, Post -op Vital signs reviewed and stable and Patient moving all extremities X 4  Post vital signs: Reviewed and stable  Last Vitals:  Filed Vitals:   12/27/14 1223  BP: 165/73  Pulse: 94  Temp: 36.3 C  Resp: 23    Complications: No apparent anesthesia complications

## 2014-12-27 NOTE — Anesthesia Preprocedure Evaluation (Addendum)
Anesthesia Evaluation  Patient identified by MRN, date of birth, ID band Patient awake    Reviewed: Allergy & Precautions, NPO status , Patient's Chart, lab work & pertinent test results  History of Anesthesia Complications Negative for: history of anesthetic complications  Airway Mallampati: II  TM Distance: >3 FB Neck ROM: Full    Dental no notable dental hx.    Pulmonary neg pulmonary ROS, former smoker,  breath sounds clear to auscultation  Pulmonary exam normal       Cardiovascular hypertension, Normal cardiovascular examRhythm:Regular Rate:Normal     Neuro/Psych CVA, No Residual Symptoms negative psych ROS   GI/Hepatic negative GI ROS, Neg liver ROS,   Endo/Other  negative endocrine ROS  Renal/GU negative Renal ROS  negative genitourinary   Musculoskeletal negative musculoskeletal ROS (+)   Abdominal   Peds negative pediatric ROS (+)  Hematology negative hematology ROS (+)   Anesthesia Other Findings   Reproductive/Obstetrics negative OB ROS                            Anesthesia Physical Anesthesia Plan  ASA: III  Anesthesia Plan: General   Post-op Pain Management:    Induction: Intravenous  Airway Management Planned: Oral ETT  Additional Equipment:   Intra-op Plan:   Post-operative Plan: Extubation in OR  Informed Consent: I have reviewed the patients History and Physical, chart, labs and discussed the procedure including the risks, benefits and alternatives for the proposed anesthesia with the patient or authorized representative who has indicated his/her understanding and acceptance.   Dental advisory given  Plan Discussed with: Surgeon and CRNA  Anesthesia Plan Comments:         Anesthesia Quick Evaluation

## 2014-12-27 NOTE — Anesthesia Postprocedure Evaluation (Signed)
  Anesthesia Post-op Note  Patient: Kenneth Clayton  Procedure(s) Performed: Procedure(s): left shoulder arthroscopy, arthroscopic debridement, open rotator cuff repair (Left)  Anesthesia type:General, Regional  Patient location: PACU  Post pain: Pain level controlled  Post assessment: Post-op Vital signs reviewed, Patient's Cardiovascular Status Stable, Respiratory Function Stable, Patent Airway and No signs of Nausea or vomiting  Post vital signs: Reviewed and stable  Last Vitals:  Filed Vitals:   12/27/14 1253  BP: 131/77  Pulse: 82  Temp:   Resp: 17    Level of consciousness: awake, alert  and patient cooperative  Complications: No apparent anesthesia complications

## 2017-04-06 IMAGING — RF DG ARTHROGRAM INJ W/FG MRI/CT SHOULDER*L*
1 series · 7 of 7 positions shown · IV contrast (isovue)
Comparison: Pre MRI instillation of contrast into left shoulder
joint. No obvious rotator cuff injury detected on limited imaging
obtained.

CLINICAL DATA: 53-year-old male with left shoulder injury lifting
weights. Initial encounter.

EXAM:
DG ARTHROGRAM INJ W/FG MRI/CT SHOULDER LEFT
FLUOROSCOPY TIME:  Radiation Exposure Index (as provided by the
fluoroscopic device): 428.26 micro Gy cm2
If the device does not provide the exposure index:
Fluoroscopy Time (in minutes and seconds):  2 minutes and 6 seconds.
TECHNIQUE: The procedure and associated associated risks (including but not
limited to infection, bleeding or incomplete exam) were discussed
with the patient. Written as well as oral witness consent was
obtained all questions answered. Time-out performed.
Under fluoroscopic guidance and aseptic technique, a 22 gauge spinal
needle was advanced into the left shoulder joint. 12 cc of mixture
of Isovue 200, saline and MultiHance was instilled.
Postprocedure instructions were reviewed with the patient. Patient
was asked to not move arm prior to MR.

[Series 1: cp_standard · 0.18mm/px · 7 of 7 slices shown]
[im 1/7]
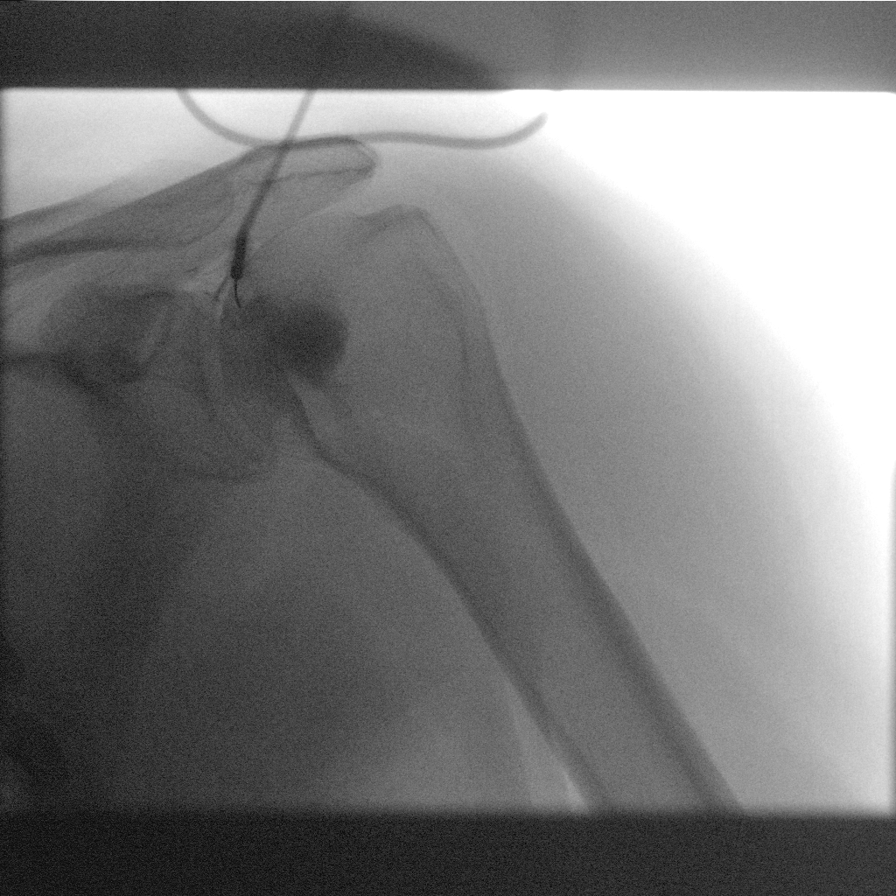
[im 2/7]
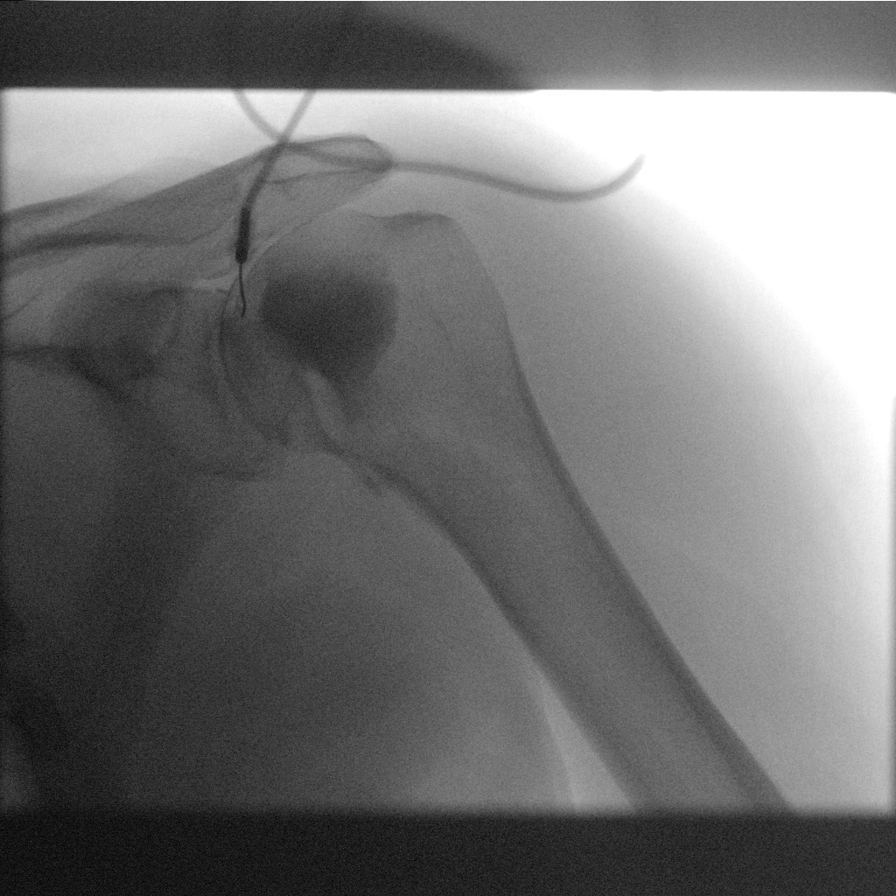
[im 3/7]
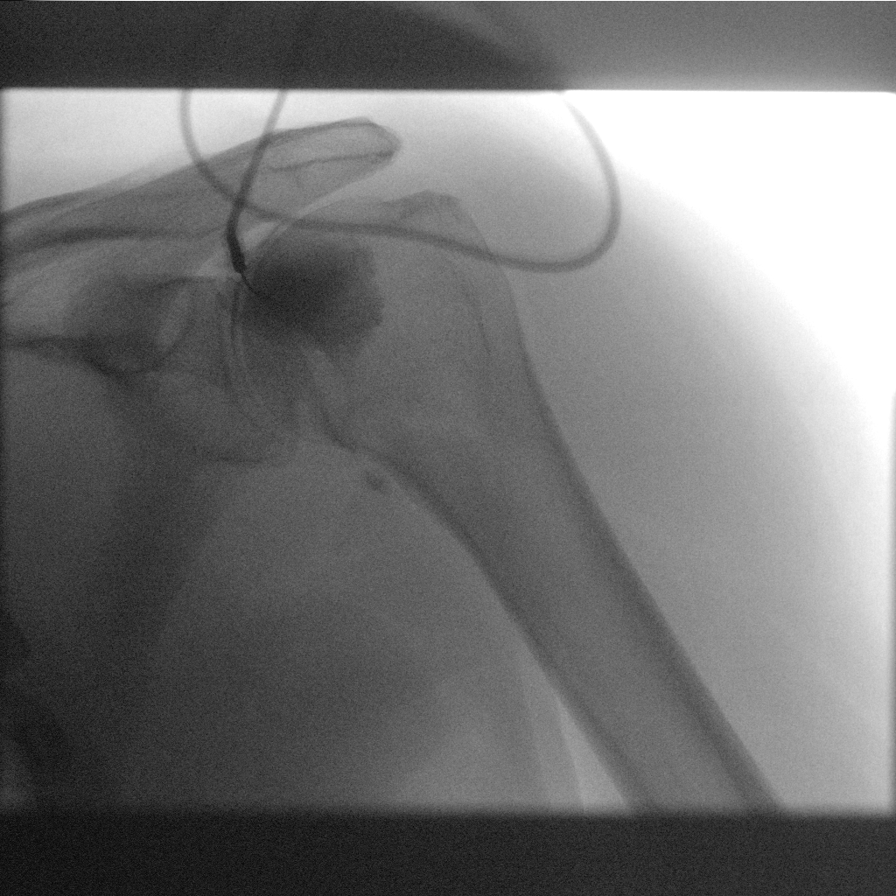
[im 4/7]
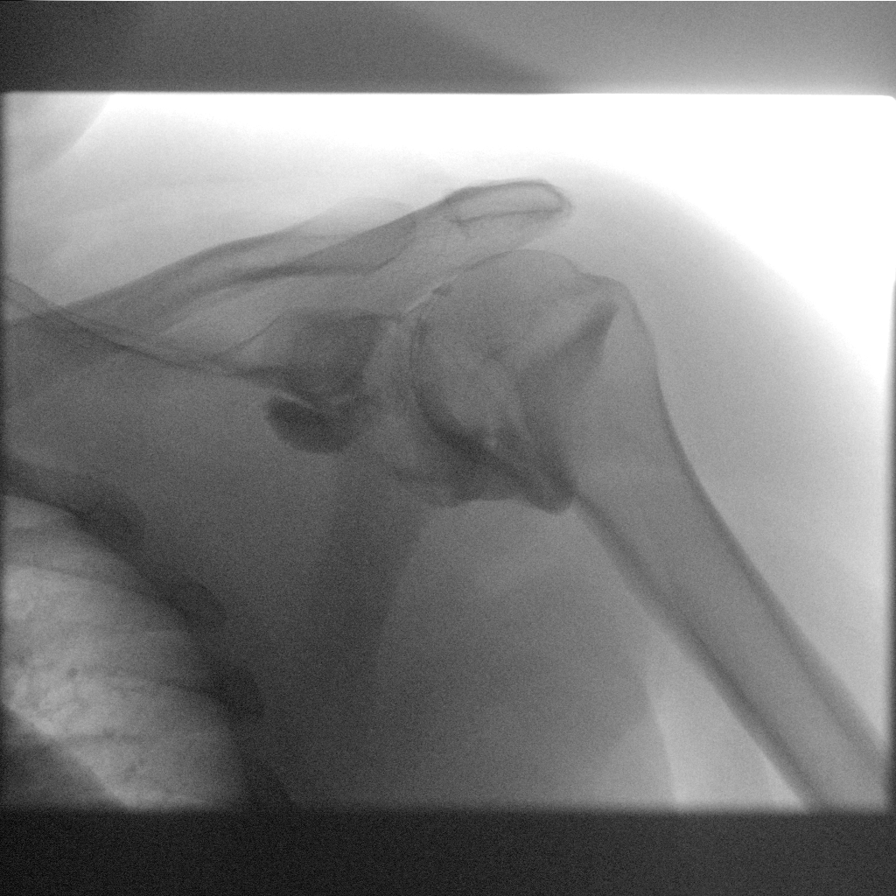
[im 5/7]
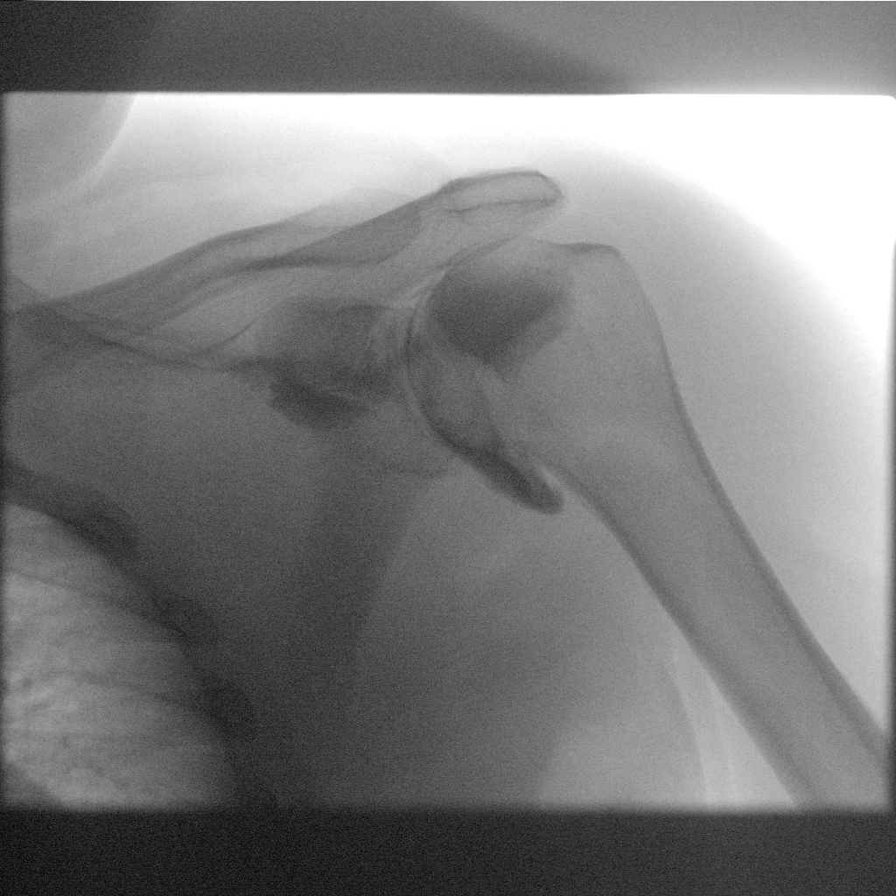
[im 6/7]
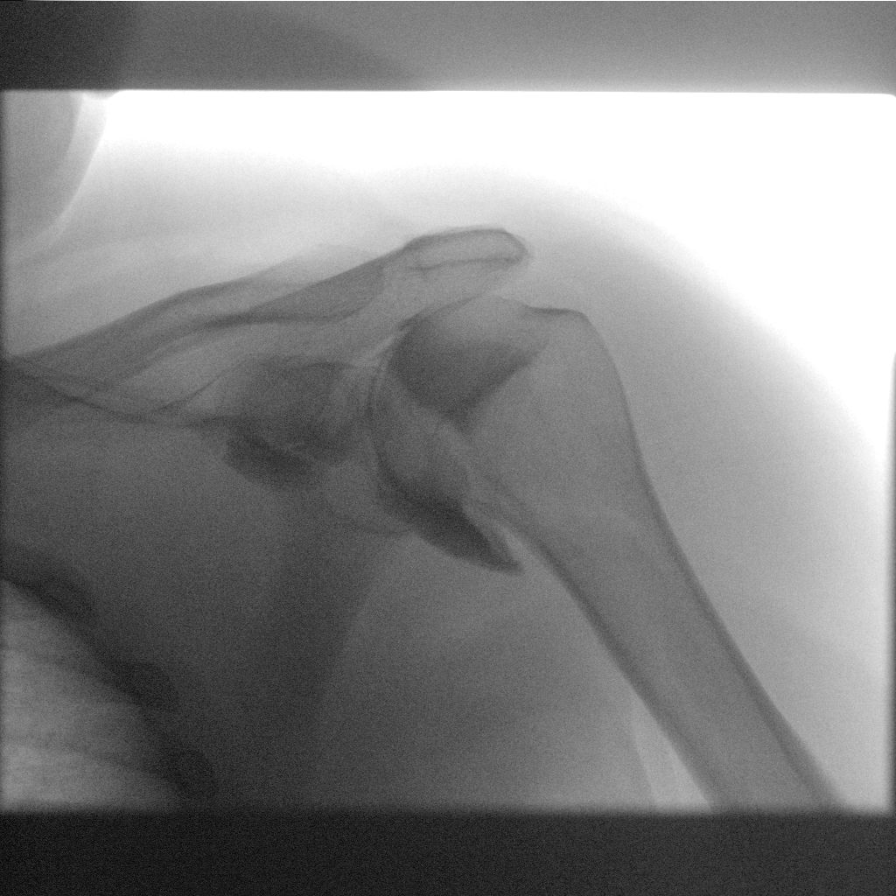
[im 7/7]
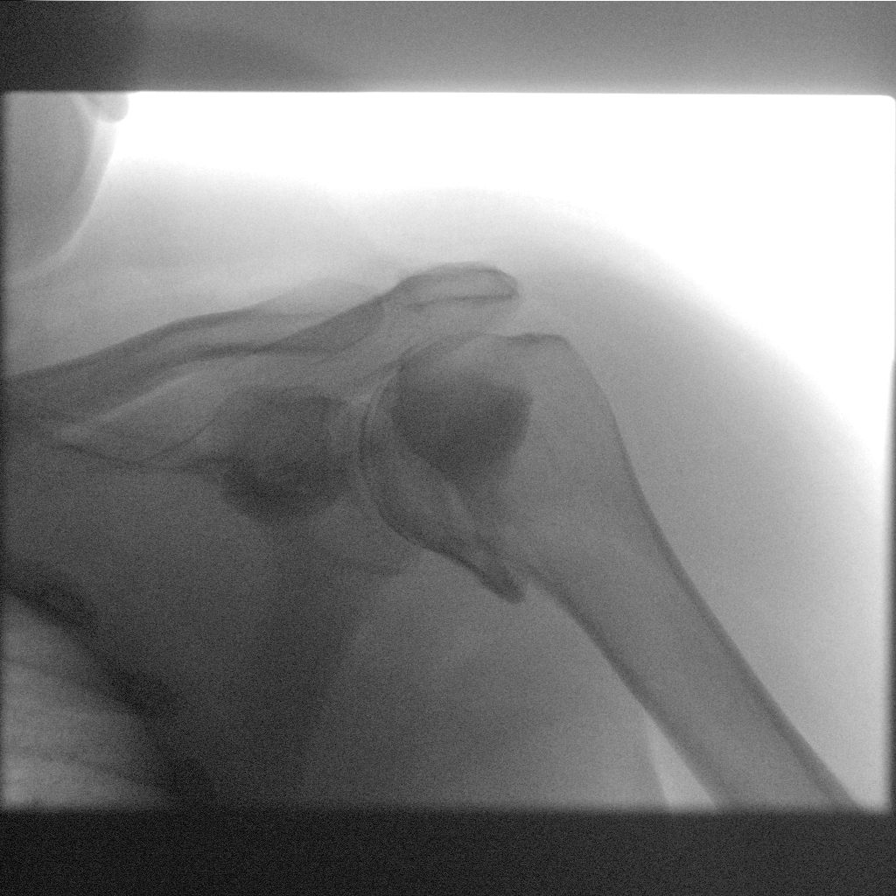

[7 of 7 positions shown; findings below may reference images not displayed]
